# Patient Record
Sex: Female | Born: 1995 | Hispanic: Yes | Marital: Single | Smoking: Never smoker
Health system: Southern US, Community
[De-identification: ages and names within clinical notes are randomized; demographics above are authoritative.]

---

## 2016-12-04 ENCOUNTER — Encounter: Payer: Self-pay | Admitting: Emergency Medicine

## 2016-12-04 ENCOUNTER — Emergency Department: Payer: Self-pay

## 2016-12-04 ENCOUNTER — Emergency Department
Admission: EM | Admit: 2016-12-04 | Discharge: 2016-12-04 | Disposition: A | Discharge status: Home / Self Care | Payer: Self-pay | Attending: Student in an Organized Health Care Education/Training Program | Admitting: Student in an Organized Health Care Education/Training Program

## 2016-12-04 DIAGNOSIS — Y9389 Activity, other specified: Secondary | ICD-10-CM | POA: Insufficient documentation

## 2016-12-04 DIAGNOSIS — X509XXA Other and unspecified overexertion or strenuous movements or postures, initial encounter: Secondary | ICD-10-CM | POA: Insufficient documentation

## 2016-12-04 DIAGNOSIS — Y999 Unspecified external cause status: Secondary | ICD-10-CM | POA: Insufficient documentation

## 2016-12-04 DIAGNOSIS — S63502A Unspecified sprain of left wrist, initial encounter: Secondary | ICD-10-CM | POA: Insufficient documentation

## 2016-12-04 DIAGNOSIS — Y929 Unspecified place or not applicable: Secondary | ICD-10-CM | POA: Insufficient documentation

## 2016-12-04 MED ORDER — NAPROXEN 500 MG PO TABS
500.0000 mg | ORAL_TABLET | Freq: Once | ORAL | Status: AC
  Administered 2016-12-04: 500 mg via ORAL
  Filled 2016-12-04: qty 1

## 2016-12-04 MED ORDER — NAPROXEN 500 MG PO TABS: 500.0000 mg | ORAL_TABLET | Freq: Two times a day (BID) | ORAL | Status: DC
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
## 2018-06-05 IMAGING — CR DG WRIST COMPLETE 3+V*L*
4 series · 4 of 4 positions shown · non-contrast
Comparison: None.

CLINICAL DATA: Fell on the wrist today.  Pain.

EXAM:
LEFT WRIST - COMPLETE 3+ VIEW

[wrist pa]
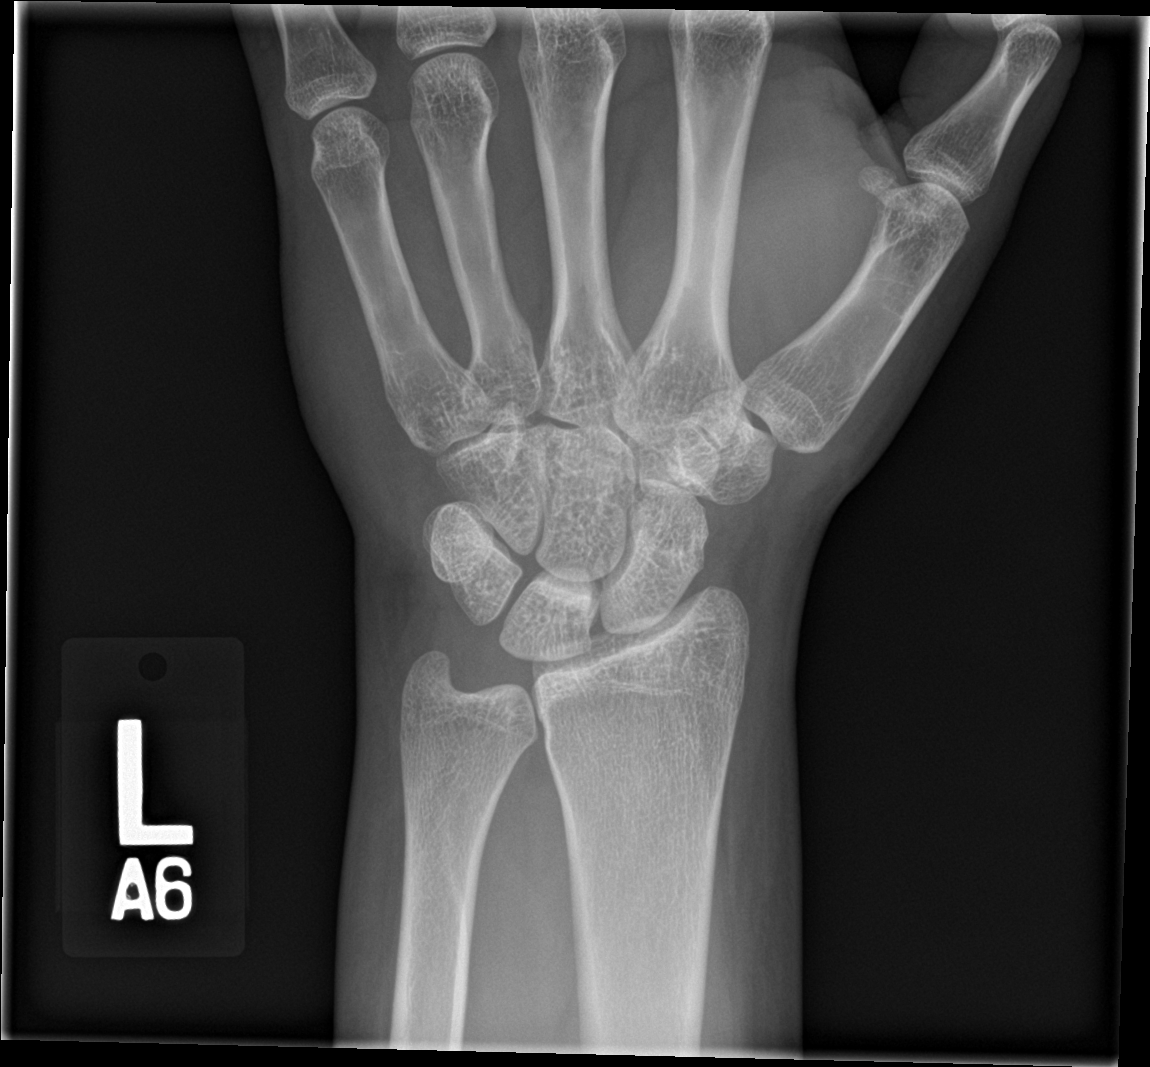

[wrist obl]
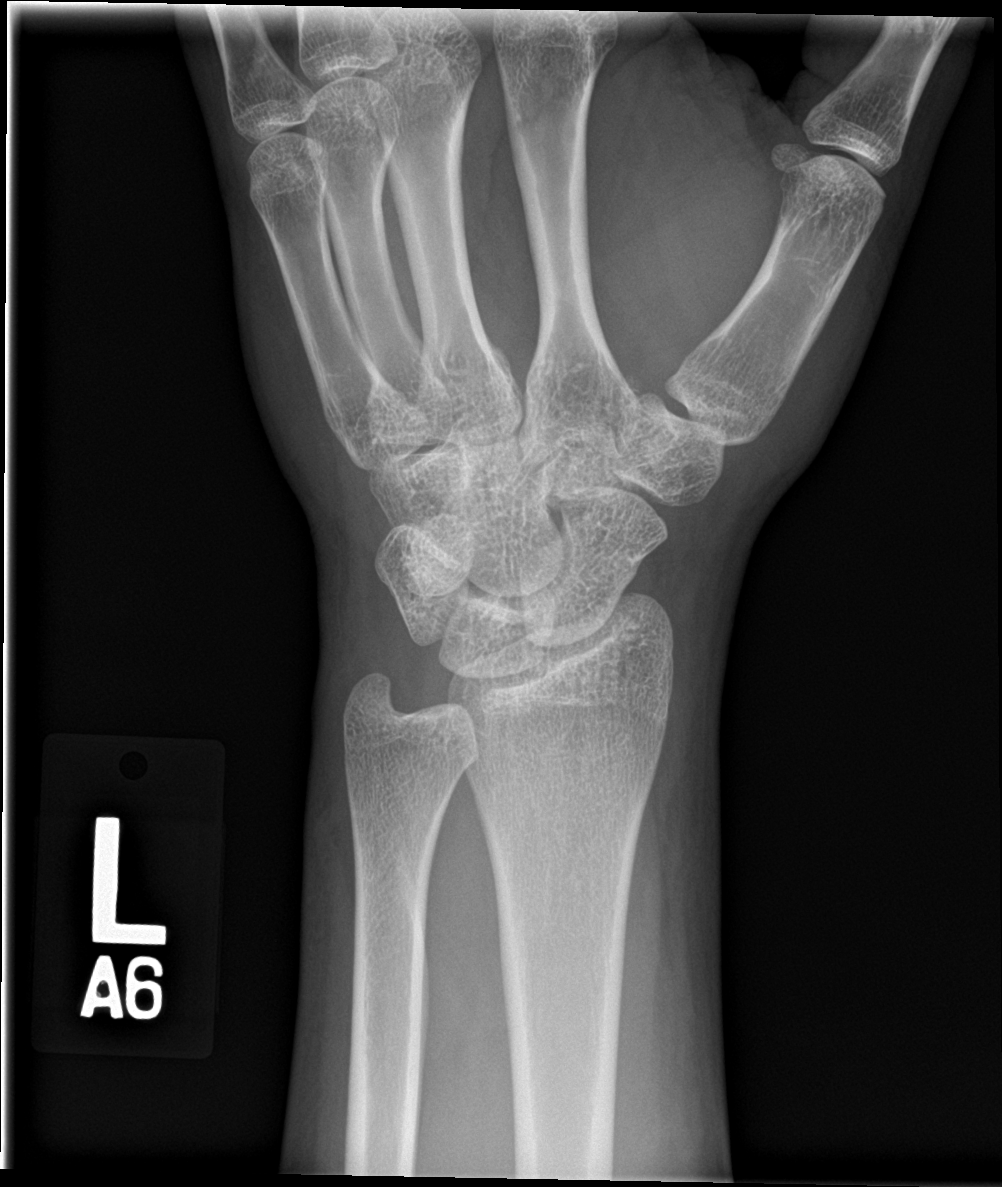

[wrist lat]
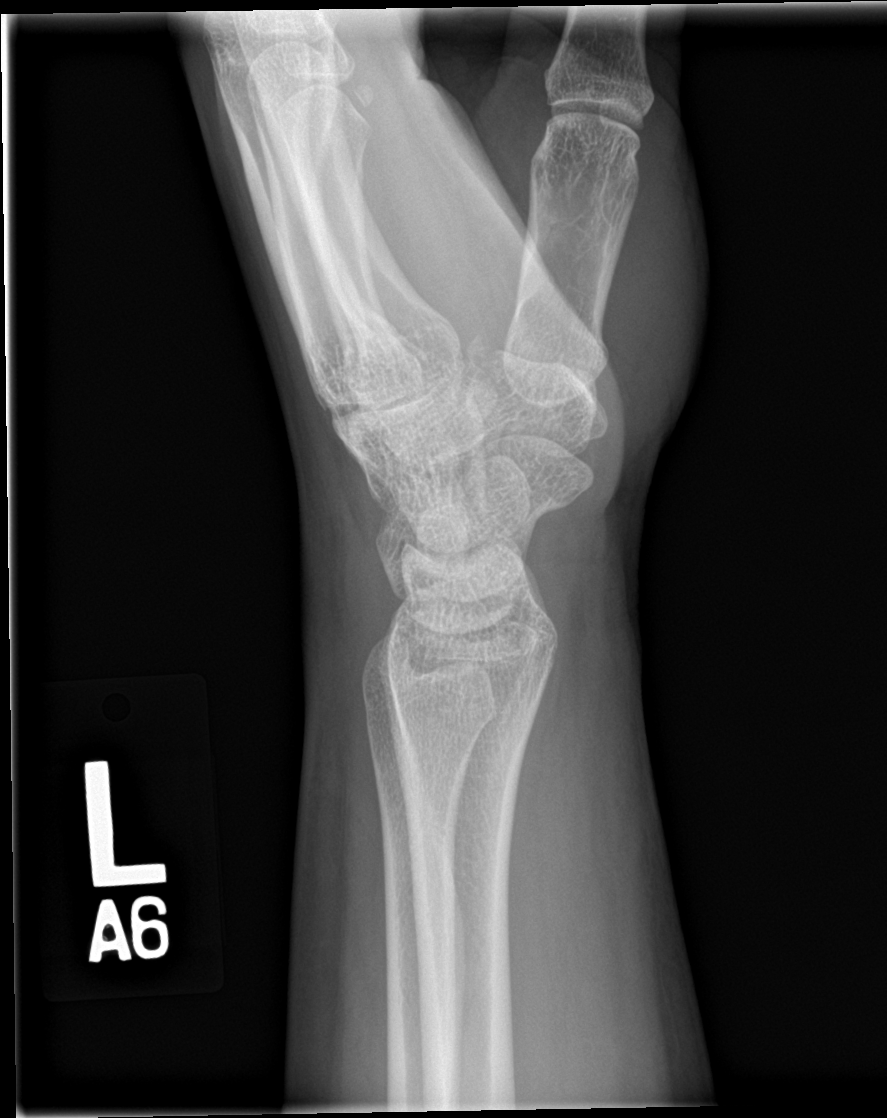

[navicular]
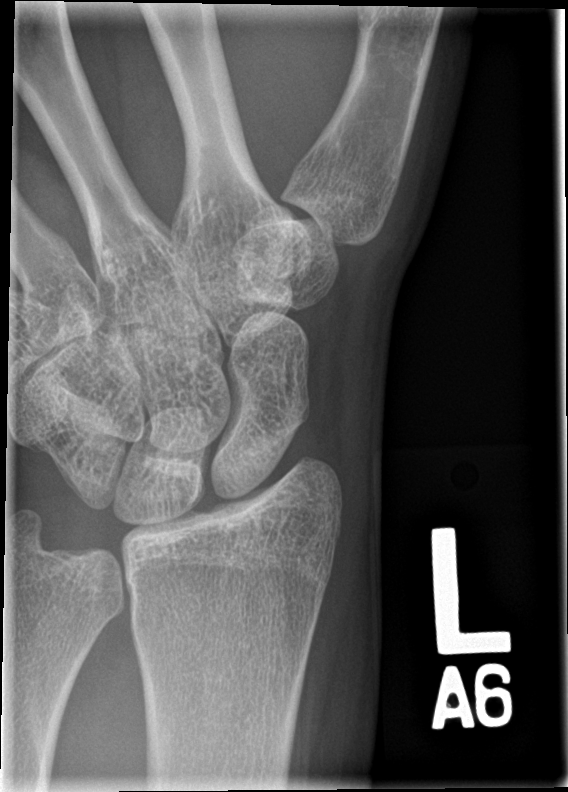

[4 of 4 positions shown; findings below may reference images not displayed]

FINDINGS: There is no evidence of fracture or dislocation. There is no
evidence of arthropathy or other focal bone abnormality. Soft
tissues are unremarkable.
IMPRESSION: Negative.

## 2019-07-05 NOTE — Progress Notes (Signed)
 Self Swab Type: Mid Turbinate

## 2020-05-24 NOTE — Progress Notes (Signed)
 Patient was reached and results were delivered.

## 2020-05-24 NOTE — Progress Notes (Signed)
 [place] Department of Health notified of results per regulations

## 2020-09-19 NOTE — Progress Notes (Signed)
 Self Swab Type: Anterior Nasal

## 2022-03-25 ENCOUNTER — Encounter: Payer: Self-pay | Admitting: Emergency Medicine

## 2022-03-25 ENCOUNTER — Emergency Department
Admission: EM | Admit: 2022-03-25 | Discharge: 2022-03-25 | Discharge status: Against Medical Advice | Attending: Emergency Medicine | Admitting: Emergency Medicine

## 2022-03-25 ENCOUNTER — Other Ambulatory Visit: Payer: Self-pay

## 2022-03-25 DIAGNOSIS — Z5321 Procedure and treatment not carried out due to patient leaving prior to being seen by health care provider: Secondary | ICD-10-CM | POA: Insufficient documentation

## 2022-03-25 DIAGNOSIS — M546 Pain in thoracic spine: Secondary | ICD-10-CM | POA: Diagnosis present

## 2022-03-25 DIAGNOSIS — Y92410 Unspecified street and highway as the place of occurrence of the external cause: Secondary | ICD-10-CM | POA: Insufficient documentation
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
## 2022-03-25 NOTE — ED Notes (Signed)
Pt asked how long the wait would be explained that the ER is very busy and I would not be able to give her a wait time. Pt states she is unable to stay and be seen. Erskine Squibb, First Nurse notified at this time.

## 2022-03-26 ENCOUNTER — Emergency Department: Payer: 59

## 2022-03-26 ENCOUNTER — Emergency Department
Admission: EM | Admit: 2022-03-26 | Discharge: 2022-03-26 | Disposition: A | Discharge status: Home / Self Care | Attending: Student in an Organized Health Care Education/Training Program | Admitting: Student in an Organized Health Care Education/Training Program

## 2022-03-26 DIAGNOSIS — S0990XA Unspecified injury of head, initial encounter: Secondary | ICD-10-CM | POA: Diagnosis not present

## 2022-03-26 DIAGNOSIS — S199XXA Unspecified injury of neck, initial encounter: Secondary | ICD-10-CM | POA: Diagnosis present

## 2022-03-26 DIAGNOSIS — S161XXA Strain of muscle, fascia and tendon at neck level, initial encounter: Secondary | ICD-10-CM | POA: Diagnosis not present

## 2022-03-26 DIAGNOSIS — Y990 Civilian activity done for income or pay: Secondary | ICD-10-CM | POA: Diagnosis not present

## 2022-03-26 MED ORDER — METHOCARBAMOL 500 MG PO TABS: 500.0000 mg | ORAL_TABLET | Freq: Three times a day (TID) | ORAL | 0 refills | Status: AC | PRN

## 2022-03-26 MED ORDER — NAPROXEN 500 MG PO TABS: 500.0000 mg | ORAL_TABLET | Freq: Two times a day (BID) | ORAL | 0 refills | Status: AC
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
## 2022-03-26 NOTE — Discharge Instructions (Signed)
Take the Naprosyn during the day when you are at work or will be driving.  If you take the Robaxin, be aware that it may make you drowsy or sleepy.
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
